# Patient Record
Sex: Female | Born: 1975 | Race: Asian | Hispanic: No | Marital: Married | Smoking: Never smoker
Health system: Southern US, Community
[De-identification: ages and names within clinical notes are randomized; demographics above are authoritative.]

## PROBLEM LIST (undated history)

## (undated) HISTORY — PX: INTRAUTERINE DEVICE INSERTION: SHX323

---

## 1998-09-10 ENCOUNTER — Inpatient Hospital Stay (HOSPITAL_COMMUNITY): Admission: AD | Admit: 1998-09-10 | Discharge: 1998-09-10 | Payer: Self-pay | Admitting: *Deleted

## 1998-09-23 ENCOUNTER — Encounter: Payer: Self-pay | Admitting: Obstetrics and Gynecology

## 1998-09-26 ENCOUNTER — Ambulatory Visit (HOSPITAL_COMMUNITY): Admission: RE | Admit: 1998-09-26 | Discharge: 1998-09-26 | Payer: Self-pay | Admitting: Obstetrics and Gynecology

## 1999-01-06 ENCOUNTER — Other Ambulatory Visit: Admission: RE | Admit: 1999-01-06 | Discharge: 1999-01-06 | Payer: Self-pay | Admitting: Obstetrics and Gynecology

## 2000-04-23 ENCOUNTER — Other Ambulatory Visit: Admission: RE | Admit: 2000-04-23 | Discharge: 2000-04-23 | Payer: Self-pay | Admitting: Obstetrics and Gynecology

## 2000-05-16 ENCOUNTER — Encounter: Payer: Self-pay | Admitting: Obstetrics and Gynecology

## 2000-05-16 ENCOUNTER — Ambulatory Visit (HOSPITAL_COMMUNITY): Admission: RE | Admit: 2000-05-16 | Discharge: 2000-05-16 | Payer: Self-pay | Admitting: Obstetrics and Gynecology

## 2000-06-13 ENCOUNTER — Ambulatory Visit (HOSPITAL_COMMUNITY): Admission: RE | Admit: 2000-06-13 | Discharge: 2000-06-13 | Payer: Self-pay | Admitting: Obstetrics and Gynecology

## 2000-06-13 ENCOUNTER — Encounter: Payer: Self-pay | Admitting: Obstetrics and Gynecology

## 2000-09-20 ENCOUNTER — Encounter: Payer: Self-pay | Admitting: Obstetrics and Gynecology

## 2000-09-20 ENCOUNTER — Ambulatory Visit (HOSPITAL_COMMUNITY): Admission: RE | Admit: 2000-09-20 | Discharge: 2000-09-20 | Payer: Self-pay | Admitting: Obstetrics and Gynecology

## 2000-10-11 ENCOUNTER — Inpatient Hospital Stay (HOSPITAL_COMMUNITY): Admission: AD | Admit: 2000-10-11 | Discharge: 2000-10-14 | Payer: Self-pay | Admitting: Obstetrics and Gynecology

## 2004-03-03 ENCOUNTER — Other Ambulatory Visit: Admission: RE | Admit: 2004-03-03 | Discharge: 2004-03-03 | Payer: Self-pay | Admitting: Obstetrics and Gynecology

## 2004-11-02 ENCOUNTER — Inpatient Hospital Stay (HOSPITAL_COMMUNITY): Admission: AD | Admit: 2004-11-02 | Discharge: 2004-11-04 | Payer: Self-pay | Admitting: Obstetrics and Gynecology

## 2005-05-21 ENCOUNTER — Other Ambulatory Visit: Admission: RE | Admit: 2005-05-21 | Discharge: 2005-05-21 | Payer: Self-pay | Admitting: Obstetrics and Gynecology

## 2012-01-24 ENCOUNTER — Ambulatory Visit: Payer: Self-pay

## 2012-02-17 ENCOUNTER — Ambulatory Visit: Payer: Self-pay | Admitting: Physician Assistant

## 2012-02-17 VITALS — BP 113/75 | HR 63 | Temp 98.0°F | Resp 16 | Ht 61.0 in | Wt 105.8 lb

## 2012-02-17 DIAGNOSIS — R05 Cough: Secondary | ICD-10-CM

## 2012-02-17 MED ORDER — ALBUTEROL SULFATE HFA 108 (90 BASE) MCG/ACT IN AERS: 2.0000 | INHALATION_SPRAY | RESPIRATORY_TRACT | Status: DC | PRN

## 2012-02-17 MED ORDER — AZITHROMYCIN 250 MG PO TABS: ORAL_TABLET | ORAL | Status: AC

## 2012-02-17 NOTE — Progress Notes (Signed)
  Subjective:    Patient ID: Penny Erickson, female    DOB: 1976/05/17, 36 y.o.   MRN: [id number]  HPI Ms. Mangas comes in today with a cough for 4 weeks.  Initially had cold symptoms with fever.  All symptoms resolved except paroxysmal cough.  No wheezing but tight cough.  Feels good otherwise.  Non smoker Healthy otherwise   Review of Systems As above     Objective:   Physical Exam  Constitutional: She is oriented to person, place, and time. Vital signs are normal. She appears well-developed and well-nourished.  HENT:  Right Ear: Tympanic membrane normal.  Left Ear: Tympanic membrane normal.  Nose: No mucosal edema.  Mouth/Throat: Oropharynx is clear and moist.  Cardiovascular: Normal rate and regular rhythm.   Pulmonary/Chest: Effort normal and breath sounds normal.  Lymphadenopathy:    She has no cervical adenopathy.  Neurological: She is alert and oriented to person, place, and time.         Assessment & Plan:  Cough, prolonged  Zithromax, albuterol inhaler, mucinex dm.  Push fluids.  Call if symptoms worsen.

## 2012-03-29 ENCOUNTER — Ambulatory Visit: Payer: Self-pay

## 2012-12-13 ENCOUNTER — Ambulatory Visit (INDEPENDENT_AMBULATORY_CARE_PROVIDER_SITE_OTHER): Payer: BC Managed Care – PPO | Admitting: Family Medicine

## 2012-12-13 VITALS — BP 115/72 | HR 76 | Temp 98.0°F | Resp 16 | Ht 61.0 in | Wt 111.0 lb

## 2012-12-13 DIAGNOSIS — R112 Nausea with vomiting, unspecified: Secondary | ICD-10-CM

## 2012-12-13 DIAGNOSIS — A088 Other specified intestinal infections: Secondary | ICD-10-CM

## 2012-12-13 DIAGNOSIS — A084 Viral intestinal infection, unspecified: Secondary | ICD-10-CM

## 2012-12-13 DIAGNOSIS — R51 Headache: Secondary | ICD-10-CM

## 2012-12-13 LAB — POCT UA - MICROSCOPIC ONLY
Bacteria, U Microscopic: NEGATIVE
Casts, Ur, LPF, POC: NEGATIVE
Crystals, Ur, HPF, POC: NEGATIVE
Mucus, UA: NEGATIVE
Yeast, UA: NEGATIVE

## 2012-12-13 LAB — POCT URINALYSIS DIPSTICK
Bilirubin, UA: NEGATIVE
Blood, UA: NEGATIVE
Glucose, UA: NEGATIVE
Ketones, UA: 40
Nitrite, UA: NEGATIVE
Protein, UA: 30
Spec Grav, UA: 1.025
Urobilinogen, UA: 1
pH, UA: 7

## 2012-12-13 LAB — POCT CBC
Granulocyte percent: 84 %G — AB (ref 37–80)
HCT, POC: 42.9 % (ref 37.7–47.9)
Hemoglobin: 13.7 g/dL (ref 12.2–16.2)
Lymph, poc: 1.1 (ref 0.6–3.4)
MCH, POC: 31.2 pg (ref 27–31.2)
MCHC: 31.9 g/dL (ref 31.8–35.4)
MCV: 97.7 fL — AB (ref 80–97)
MID (cbc): 0.3 (ref 0–0.9)
MPV: 7.5 fL (ref 0–99.8)
POC Granulocyte: 7.4 — AB (ref 2–6.9)
POC LYMPH PERCENT: 12.7 %L (ref 10–50)
POC MID %: 3.3 %M (ref 0–12)
Platelet Count, POC: 262 10*3/uL (ref 142–424)
RBC: 4.39 M/uL (ref 4.04–5.48)
RDW, POC: 12.3 %
WBC: 8.8 10*3/uL (ref 4.6–10.2)

## 2012-12-13 MED ORDER — ONDANSETRON 4 MG PO TBDP: ORAL_TABLET | ORAL | Status: DC

## 2012-12-13 MED ORDER — DICYCLOMINE HCL 10 MG PO CAPS: 10.0000 mg | ORAL_CAPSULE | Freq: Three times a day (TID) | ORAL | Status: DC

## 2012-12-13 NOTE — Progress Notes (Signed)
Subjective: 37 year old lady from [place]. She is getting sick last night. She had nausea and vomiting. When she would vomit her bowels moved but did not have diarrhea. She has had had a bad headache and some dizziness. No fever. Her children have had some respiratory tract illnesses, but no one else with the same vomiting illness in her home. She works doing Corporate treasurer. She took 2 Mucinex pills this morning.  Objective: She does not look like she feels well. Her TMs are normal. Eyes PERRLA. Throat clear. Neck supple without nodes. Her upper back has some red streaking where she rubbed hard in alleviating the headache. Her chest is clear. Heart regular without murmurs. Abdomen soft without masses tenderness.  Assessment: Gastritis Headache Dizziness  Plan: Check urine and CBC Results for orders placed in visit on 12/13/12  POCT CBC      Result Value Range   WBC 8.8  4.6 - 10.2 K/uL   Lymph, poc 1.1  0.6 - 3.4   POC LYMPH PERCENT 12.7  10 - 50 %L   MID (cbc) 0.3  0 - 0.9   POC MID % 3.3  0 - 12 %M   POC Granulocyte 7.4 (*) 2 - 6.9   Granulocyte percent 84.0 (*) 37 - 80 %G   RBC 4.39  4.04 - 5.48 M/uL   Hemoglobin 13.7  12.2 - 16.2 g/dL   HCT, POC 16.1  09.6 - 47.9 %   MCV 97.7 (*) 80 - 97 fL   MCH, POC 31.2  27 - 31.2 pg   MCHC 31.9  31.8 - 35.4 g/dL   RDW, POC 04.5     Platelet Count, POC 262  142 - 424 K/uL   MPV 7.5  0 - 99.8 fL  POCT UA - MICROSCOPIC ONLY      Result Value Range   WBC, Ur, HPF, POC 3-5     RBC, urine, microscopic 0-1     Bacteria, U Microscopic neg     Mucus, UA neg     Epithelial cells, urine per micros 0-2     Crystals, Ur, HPF, POC neg     Casts, Ur, LPF, POC neg     Yeast, UA neg    POCT URINALYSIS DIPSTICK      Result Value Range   Color, UA yellow     Clarity, UA cloudy     Glucose, UA neg     Bilirubin, UA neg     Ketones, UA 40     Spec Grav, UA 1.025     Blood, UA neg     pH, UA 7.0     Protein, UA 30     Urobilinogen, UA  1.0     Nitrite, UA neg     Leukocytes, UA Trace     Assessment: Viral gastroenteritis with mild dehydration  Treat symptomatically. We'll give some bentyl

## 2012-12-13 NOTE — Patient Instructions (Addendum)
Encourage fluids.  No milk or alcohol.  Use water, tea, sodas, juice, soup or broth.  You should drink enough so you urinate (pee) very often.  Take the ondansetron every 6 hours if needed for nausea and vomiting.    Take the dicyclomine before meals and at bedtime to help vomiting, cramping or diarrhea.  Take acetominophen (tylenol) 2 pills every 6-8 hours if needed for headache and neck pain.  Return if worse.

## 2014-03-24 ENCOUNTER — Ambulatory Visit (INDEPENDENT_AMBULATORY_CARE_PROVIDER_SITE_OTHER): Payer: BC Managed Care – PPO | Admitting: Emergency Medicine

## 2014-03-24 VITALS — BP 112/78 | HR 76 | Temp 97.7°F | Resp 16 | Ht 60.5 in | Wt 110.8 lb

## 2014-03-24 DIAGNOSIS — J018 Other acute sinusitis: Secondary | ICD-10-CM

## 2014-03-24 DIAGNOSIS — J209 Acute bronchitis, unspecified: Secondary | ICD-10-CM

## 2014-03-24 MED ORDER — AMOXICILLIN-POT CLAVULANATE 875-125 MG PO TABS: 1.0000 | ORAL_TABLET | Freq: Two times a day (BID) | ORAL | Status: DC

## 2014-03-24 MED ORDER — PROMETHAZINE-CODEINE 6.25-10 MG/5ML PO SYRP: 5.0000 mL | ORAL_SOLUTION | Freq: Four times a day (QID) | ORAL | Status: DC | PRN

## 2014-03-24 MED ORDER — PSEUDOEPHEDRINE-GUAIFENESIN ER 60-600 MG PO TB12: 1.0000 | ORAL_TABLET | Freq: Two times a day (BID) | ORAL | Status: DC

## 2014-03-24 NOTE — Progress Notes (Signed)
[facility] [address], [place] [id number] [phone]  Date:  03/24/2014   Name:  Penny Erickson   DOB:  11/07/1975   MRN:  [id number]  PCP:  Tally DueGUEST, CHRIS WARREN, MD    Chief Complaint: Cough, Sore Throat and Nasal Congestion   History of Present Illness:  Penny Erickson is a 38 y.o. very pleasant female patient who presents with the following:  Started with sore throat last week.  By the weekend she developed a cough and nasal drainage productive of mucocutaneous sputum.  No fever or chills. No wheezing but has some sense she cannot breath easily.  No nausea or vomiting.  No stool change or rash.  Sore throat improved with OTC pain medications.  Denies other complaint or health concern today.   There are no active problems to display for this patient.   No past medical history on file.  Past Surgical History  Procedure Laterality Date  . Intrauterine device insertion      History  Substance Use Topics  . Smoking status: Never Smoker   . Smokeless tobacco: Not on file  . Alcohol Use: No    Family History  Problem Relation Age of Onset  . Diabetes Maternal Grandmother     No Known Allergies  Medication list has been reviewed and updated.  Current Outpatient Prescriptions on File Prior to Visit  Medication Sig Dispense Refill  . albuterol (PROVENTIL HFA;VENTOLIN HFA) 108 (90 BASE) MCG/ACT inhaler Inhale 2 puffs into the lungs every 4 (four) hours as needed for wheezing.  1 Inhaler  0   No current facility-administered medications on file prior to visit.    Review of Systems:  As per HPI, otherwise negative.    Physical Examination: Filed Vitals:   03/24/14 1754  BP: 112/78  Pulse: 76  Temp: 97.7 F (36.5 C)  Resp: 16   Filed Vitals:   03/24/14 1754  Height: 5' 0.5" (1.537 m)  Weight: 110 lb 12.8 oz (50.259 kg)   Body mass index is 21.27 kg/(m^2). Ideal Body Weight: Weight in (lb) to have BMI = 25: 129.9  GEN: WDWN, NAD,  Non-toxic, A & O x 3 HEENT: Atraumatic, Normocephalic. Neck supple. No masses, No LAD. Ears and Nose: No external deformity. CV: RRR, No M/G/R. No JVD. No thrill. No extra heart sounds. PULM: CTA B, no wheezes, crackles, rhonchi. No retractions. No resp. distress. No accessory muscle use. ABD: S, NT, ND, +BS. No rebound. No HSM. EXTR: No c/c/e NEURO Normal gait.  PSYCH: Normally interactive. Conversant. Not depressed or anxious appearing.  Calm demeanor.    Assessment and Plan: Sinusitis Bronchitis Augmentin mucinex d Phen c cod   Signed,  Phillips OdorJeffery Anderson, MD

## 2014-03-24 NOTE — Patient Instructions (Signed)
Bronchitis  Bronchitis is inflammation of the airways that extend from the windpipe into the lungs (bronchi). The inflammation often causes mucus to develop, which leads to a cough. If the inflammation becomes severe, it may cause shortness of breath.  CAUSES   Bronchitis may be caused by:   · Viral infections.    · Bacteria.    · Cigarette smoke.    · Allergens, pollutants, and other irritants.    SIGNS AND SYMPTOMS   The most common symptom of bronchitis is a frequent cough that produces mucus. Other symptoms include:  · Fever.    · Body aches.    · Chest congestion.    · Chills.    · Shortness of breath.    · Sore throat.    DIAGNOSIS   Bronchitis is usually diagnosed through a medical history and physical exam. Tests, such as chest X-rays, are sometimes done to rule out other conditions.   TREATMENT   You may need to avoid contact with whatever caused the problem (smoking, for example). Medicines are sometimes needed. These may include:  · Antibiotics. These may be prescribed if the condition is caused by bacteria.  · Cough suppressants. These may be prescribed for relief of cough symptoms.    · Inhaled medicines. These may be prescribed to help open your airways and make it easier for you to breathe.    · Steroid medicines. These may be prescribed for those with recurrent (chronic) bronchitis.  HOME CARE INSTRUCTIONS  · Get plenty of rest.    · Drink enough fluids to keep your urine clear or pale yellow (unless you have a medical condition that requires fluid restriction). Increasing fluids may help thin your secretions and will prevent dehydration.    · Only take over-the-counter or prescription medicines as directed by your health care Neysa Arts.  · Only take antibiotics as directed. Make sure you finish them even if you start to feel better.  · Avoid secondhand smoke, irritating chemicals, and strong fumes. These will make bronchitis worse. If you are a smoker, quit smoking. Consider using nicotine gum or  skin patches to help control withdrawal symptoms. Quitting smoking will help your lungs heal faster.    · Put a cool-mist humidifier in your bedroom at night to moisten the air. This may help loosen mucus. Change the water in the humidifier daily. You can also run the hot water in your shower and sit in the bathroom with the door closed for 5-10 minutes.    · Follow up with your health care Garvey Westcott as directed.    · Wash your hands frequently to avoid catching bronchitis again or spreading an infection to others.    SEEK MEDICAL CARE IF:  Your symptoms do not improve after 1 week of treatment.   SEEK IMMEDIATE MEDICAL CARE IF:  · Your fever increases.  · You have chills.    · You have chest pain.    · You have worsening shortness of breath.    · You have bloody sputum.  · You faint.    · You have lightheadedness.  · You have a severe headache.    · You vomit repeatedly.  MAKE SURE YOU:   · Understand these instructions.  · Will watch your condition.  · Will get help right away if you are not doing well or get worse.  Document Released: 09/24/2005 Document Revised: 07/15/2013 Document Reviewed: 05/19/2013  ExitCare® Patient Information ©2015 [employer] LLC. This information is not intended to replace advice given to you by your health care Daxtin Leiker. Make sure you discuss any questions you have with your health care   Abdiaziz Klahn.  Sinusitis  Sinusitis is redness, soreness, and swelling (inflammation) of the paranasal sinuses. Paranasal sinuses are air pockets within the bones of your face (beneath the eyes, the middle of the forehead, or above the eyes). In healthy paranasal sinuses, mucus is able to drain out, and air is able to circulate through them by way of your nose. However, when your paranasal sinuses are inflamed, mucus and air can become trapped. This can allow bacteria and other germs to grow and cause infection.  Sinusitis can develop quickly and last only a short time (acute) or continue over a long period  (chronic). Sinusitis that lasts for more than 12 weeks is considered chronic.   CAUSES   Causes of sinusitis include:  · Allergies.  · Structural abnormalities, such as displacement of the cartilage that separates your nostrils (deviated septum), which can decrease the air flow through your nose and sinuses and affect sinus drainage.  · Functional abnormalities, such as when the small hairs (cilia) that line your sinuses and help remove mucus do not work properly or are not present.  SYMPTOMS   Symptoms of acute and chronic sinusitis are the same. The primary symptoms are pain and pressure around the affected sinuses. Other symptoms include:  · Upper toothache.  · Earache.  · Headache.  · Bad breath.  · Decreased sense of smell and taste.  · A cough, which worsens when you are lying flat.  · Fatigue.  · Fever.  · Thick drainage from your nose, which often is green and may contain pus (purulent).  · Swelling and warmth over the affected sinuses.  DIAGNOSIS   Your caregiver will perform a physical exam. During the exam, your caregiver may:  · Look in your nose for signs of abnormal growths in your nostrils (nasal polyps).  · Tap over the affected sinus to check for signs of infection.  · View the inside of your sinuses (endoscopy) with a special imaging device with a light attached (endoscope), which is inserted into your sinuses.  If your caregiver suspects that you have chronic sinusitis, one or more of the following tests may be recommended:  · Allergy tests.  · Nasal culture--A sample of mucus is taken from your nose and sent to a lab and screened for bacteria.  · Nasal cytology--A sample of mucus is taken from your nose and examined by your caregiver to determine if your sinusitis is related to an allergy.  TREATMENT   Most cases of acute sinusitis are related to a viral infection and will resolve on their own within 10 days. Sometimes medicines are prescribed to help relieve symptoms (pain medicine,  decongestants, nasal steroid sprays, or saline sprays).   However, for sinusitis related to a bacterial infection, your caregiver will prescribe antibiotic medicines. These are medicines that will help kill the bacteria causing the infection.   Rarely, sinusitis is caused by a fungal infection. In theses cases, your caregiver will prescribe antifungal medicine.  For some cases of chronic sinusitis, surgery is needed. Generally, these are cases in which sinusitis recurs more than 3 times per year, despite other treatments.  HOME CARE INSTRUCTIONS   · Drink plenty of water. Water helps thin the mucus so your sinuses can drain more easily.  · Use a humidifier.  · Inhale steam 3 to 4 times a day (for example, sit in the bathroom with the shower running).  · Apply a warm, moist washcloth to your face 3 to 4 times a   day, or as directed by your caregiver.  · Use saline nasal sprays to help moisten and clean your sinuses.  · Take over-the-counter or prescription medicines for pain, discomfort, or fever only as directed by your caregiver.  SEEK IMMEDIATE MEDICAL CARE IF:  · You have increasing pain or severe headaches.  · You have nausea, vomiting, or drowsiness.  · You have swelling around your face.  · You have vision problems.  · You have a stiff neck.  · You have difficulty breathing.  MAKE SURE YOU:   · Understand these instructions.  · Will watch your condition.  · Will get help right away if you are not doing well or get worse.  Document Released: 09/24/2005 Document Revised: 12/17/2011 Document Reviewed: 10/09/2011  ExitCare® Patient Information ©2015 [employer], LLC. This information is not intended to replace advice given to you by your health care Katrece Roediger. Make sure you discuss any questions you have with your health care Shaquoia Miers.

## 2014-04-12 ENCOUNTER — Ambulatory Visit (INDEPENDENT_AMBULATORY_CARE_PROVIDER_SITE_OTHER): Payer: BC Managed Care – PPO | Admitting: Family Medicine

## 2014-04-12 VITALS — HR 70 | Temp 97.2°F | Resp 16 | Ht 60.75 in | Wt 111.0 lb

## 2014-04-12 DIAGNOSIS — R5381 Other malaise: Secondary | ICD-10-CM

## 2014-04-12 DIAGNOSIS — R5383 Other fatigue: Secondary | ICD-10-CM

## 2014-04-12 DIAGNOSIS — R05 Cough: Secondary | ICD-10-CM

## 2014-04-12 DIAGNOSIS — N920 Excessive and frequent menstruation with regular cycle: Secondary | ICD-10-CM

## 2014-04-12 DIAGNOSIS — J209 Acute bronchitis, unspecified: Secondary | ICD-10-CM

## 2014-04-12 DIAGNOSIS — R059 Cough, unspecified: Secondary | ICD-10-CM

## 2014-04-12 LAB — CBC WITH DIFFERENTIAL/PLATELET
BASOS ABS: 0.1 10*3/uL (ref 0.0–0.1)
Basophils Relative: 1 % (ref 0–1)
Eosinophils Absolute: 0.3 10*3/uL (ref 0.0–0.7)
Eosinophils Relative: 4 % (ref 0–5)
HCT: 39.1 % (ref 36.0–46.0)
Hemoglobin: 13.7 g/dL (ref 12.0–15.0)
LYMPHS ABS: 2.3 10*3/uL (ref 0.7–4.0)
LYMPHS PCT: 34 % (ref 12–46)
MCH: 31.9 pg (ref 26.0–34.0)
MCHC: 35 g/dL (ref 30.0–36.0)
MCV: 91.1 fL (ref 78.0–100.0)
Monocytes Absolute: 0.4 10*3/uL (ref 0.1–1.0)
Monocytes Relative: 6 % (ref 3–12)
NEUTROS PCT: 55 % (ref 43–77)
Neutro Abs: 3.7 10*3/uL (ref 1.7–7.7)
Platelets: 251 10*3/uL (ref 150–400)
RBC: 4.29 MIL/uL (ref 3.87–5.11)
RDW: 12.2 % (ref 11.5–15.5)
WBC: 6.8 10*3/uL (ref 4.0–10.5)

## 2014-04-12 LAB — POCT SEDIMENTATION RATE: POCT SED RATE: 12 mm/hr (ref 0–22)

## 2014-04-12 MED ORDER — AZITHROMYCIN 250 MG PO TABS: ORAL_TABLET | ORAL | Status: DC

## 2014-04-12 MED ORDER — ALBUTEROL SULFATE HFA 108 (90 BASE) MCG/ACT IN AERS: 2.0000 | INHALATION_SPRAY | RESPIRATORY_TRACT | Status: DC | PRN

## 2014-04-12 MED ORDER — BENZONATATE 200 MG PO CAPS: 200.0000 mg | ORAL_CAPSULE | Freq: Three times a day (TID) | ORAL | Status: DC | PRN

## 2014-04-12 NOTE — Progress Notes (Signed)
Subjective:  This chart was scribed for Penny Cheadle, MD by Randa Evens, ED Scribe. This Patient was seen in room 04 and the patients care was started at 6:52 PM   Patient ID: Penny Erickson, female    DOB: [birth year], 38 y.o.   MRN: [id number]  Cough Associated symptoms include myalgias and shortness of breath. Pertinent negatives include no chest pain, fever or wheezing.   Chief Complaint  Patient presents with  . Cough    All X 3 days  . Fatigue  . Generalized Body Aches    HPI Comments: Penny Erickson is a 38 y.o. female who presents to [facility] complaining of cough onset 3 days prior. She states that she has associated fatigue, myalgias, and SOB. She states that her cough is worsened with activity. She states she has taken cough medicine with some relief. She states she has taken mucinex with no relief. She denies using the inhaler that was previously prescribed. She states that she has not had any recent sick contacts.She states she had a sinus infection 3 weeks prior for which she fully recovered but only for a few days before she developed this illness. She denies fever, chest pain, wheezing, nausea, or vomiting. She denies any recent travel outside of the country.   She states that her menstrual cycle has been 2 weeks long and that she has this prob freq.   Pt is [place] and is here with her husband. English is somewhat limited.  History reviewed. No pertinent past medical history. Current Outpatient Prescriptions on File Prior to Visit  Medication Sig Dispense Refill  . pseudoephedrine-guaifenesin (MUCINEX D) 60-600 MG per tablet Take 1 tablet by mouth every 12 (twelve) hours.  18 tablet  0   No current facility-administered medications on file prior to visit.   No Known Allergies  Review of Systems  Constitutional: Positive for fatigue. Negative for fever.  Respiratory: Positive for cough and shortness of breath. Negative for wheezing.     Cardiovascular: Negative for chest pain.  Gastrointestinal: Negative for nausea and vomiting.  Musculoskeletal: Positive for myalgias.     Objective:  Pulse 70  Temp(Src) 97.2 F (36.2 C) (Oral)  Resp 16  Ht 5' 0.75" (1.543 m)  Wt 111 lb (50.349 kg)  BMI 21.15 kg/m2  SpO2 100%  PF 410 L/min  LMP 03/02/2014  Physical Exam  Nursing note and vitals reviewed. Constitutional: She is oriented to person, place, and time. She appears well-developed and well-nourished. No distress.  HENT:  Head: Normocephalic and atraumatic.  Right Ear: A middle ear effusion is present.  Left Ear: A middle ear effusion is present.  Nose: Mucosal edema (pale) and rhinorrhea present.  Mouth/Throat: Oropharynx is clear and moist.  Eyes: Conjunctivae and EOM are normal.  Neck: Neck supple. No tracheal deviation present.  Cardiovascular: Normal rate, regular rhythm, S1 normal, S2 normal and normal heart sounds.   Pulmonary/Chest: Effort normal and breath sounds normal. No respiratory distress.  Musculoskeletal: Normal range of motion.  Lymphadenopathy:    She has cervical adenopathy (mild anterior).  Neurological: She is alert and oriented to person, place, and time.  Skin: Skin is warm and dry.  Psychiatric: She has a normal mood and affect. Her behavior is normal.    Predicted peak flow 475 - pt's was 410  Assessment & Plan:   Cough - Plan: POCT SEDIMENTATION RATE, CBC with Differential  Acute bronchitis, unspecified organism  Other malaise and fatigue  Menorrhagia  with regular cycle  Meds ordered this encounter  Medications  . azithromycin (ZITHROMAX) 250 MG tablet    Sig: Take 2 tabs PO x 1 dose, then 1 tab PO QD x 4 days    Dispense:  6 tablet    Refill:  0  . benzonatate (TESSALON) 200 MG capsule    Sig: Take 1 capsule (200 mg total) by mouth 3 (three) times daily as needed for cough.    Dispense:  30 capsule    Refill:  0  . albuterol (PROVENTIL HFA;VENTOLIN HFA) 108 (90 BASE)  MCG/ACT inhaler    Sig: Inhale 2 puffs into the lungs every 4 (four) hours as needed for wheezing or shortness of breath (cough, shortness of breath or wheezing.).    Dispense:  1 Inhaler    Refill:  1    I personally performed the services described in this documentation, which was scribed in my presence. The recorded information has been reviewed and considered, and addended by me as needed.  Penny Cheadle, MD MPH  Results for orders placed in visit on 04/12/14  CBC WITH DIFFERENTIAL      Result Value Ref Range   WBC 6.8  4.0 - 10.5 K/uL   RBC 4.29  3.87 - 5.11 MIL/uL   Hemoglobin 13.7  12.0 - 15.0 g/dL   HCT 39.1  36.0 - 46.0 %   MCV 91.1  78.0 - 100.0 fL   MCH 31.9  26.0 - 34.0 pg   MCHC 35.0  30.0 - 36.0 g/dL   RDW 12.2  11.5 - 15.5 %   Platelets 251  150 - 400 K/uL   Neutrophils Relative % 55  43 - 77 %   Neutro Abs 3.7  1.7 - 7.7 K/uL   Lymphocytes Relative 34  12 - 46 %   Lymphs Abs 2.3  0.7 - 4.0 K/uL   Monocytes Relative 6  3 - 12 %   Monocytes Absolute 0.4  0.1 - 1.0 K/uL   Eosinophils Relative 4  0 - 5 %   Eosinophils Absolute 0.3  0.0 - 0.7 K/uL   Basophils Relative 1  0 - 1 %   Basophils Absolute 0.1  0.0 - 0.1 K/uL   Smear Review Criteria for review not met    POCT SEDIMENTATION RATE      Result Value Ref Range   POCT SED RATE 12  0 - 22 mm/hr

## 2014-04-12 NOTE — Patient Instructions (Signed)
If your cough or shortness of breath continues, make sure you come back in immediately. If you develop wheezing or chest pain or heart racing/skipping beats, make sure you come back in immediately or go to ER. Take aleve twice daily with food to help with back pain with deep breathing and coughing.  Acute Bronchitis Bronchitis is inflammation of the airways that extend from the windpipe into the lungs (bronchi). The inflammation often causes mucus to develop. This leads to a cough, which is the most common symptom of bronchitis.  In acute bronchitis, the condition usually develops suddenly and goes away over time, usually in a couple weeks. Smoking, allergies, and asthma can make bronchitis worse. Repeated episodes of bronchitis may cause further lung problems.  CAUSES Acute bronchitis is most often caused by the same virus that causes a cold. The virus can spread from person to person (contagious).  SIGNS AND SYMPTOMS   Cough.   Fever.   Coughing up mucus.   Body aches.   Chest congestion.   Chills.   Shortness of breath.   Sore throat.  DIAGNOSIS  Acute bronchitis is usually diagnosed through a physical exam. Tests, such as chest X-rays, are sometimes done to rule out other conditions.  TREATMENT  Acute bronchitis usually goes away in a couple weeks. Often times, no medical treatment is necessary. Medicines are sometimes given for relief of fever or cough. Antibiotics are usually not needed but may be prescribed in certain situations. In some cases, an inhaler may be recommended to help reduce shortness of breath and control the cough. A cool mist vaporizer may also be used to help thin bronchial secretions and make it easier to clear the chest.  HOME CARE INSTRUCTIONS  Get plenty of rest.   Drink enough fluids to keep your urine clear or pale yellow (unless you have a medical condition that requires fluid restriction). Increasing fluids may help thin your secretions and  will prevent dehydration.   Only take over-the-counter or prescription medicines as directed by your health care provider.   Avoid smoking and secondhand smoke. Exposure to cigarette smoke or irritating chemicals will make bronchitis worse. If you are a smoker, consider using nicotine gum or skin patches to help control withdrawal symptoms. Quitting smoking will help your lungs heal faster.   Reduce the chances of another bout of acute bronchitis by washing your hands frequently, avoiding people with cold symptoms, and trying not to touch your hands to your mouth, nose, or eyes.   Follow up with your health care provider as directed.  SEEK MEDICAL CARE IF: Your symptoms do not improve after 1 week of treatment.  SEEK IMMEDIATE MEDICAL CARE IF:  You develop an increased fever or chills.   You have chest pain.   You have severe shortness of breath.  You have bloody sputum.   You develop dehydration.  You develop fainting.  You develop repeated vomiting.  You develop a severe headache. MAKE SURE YOU:   Understand these instructions.  Will watch your condition.  Will get help right away if you are not doing well or get worse. Document Released: 11/01/2004 Document Revised: 05/27/2013 Document Reviewed: 03/17/2013 [facility] Patient Information 2015 [place]. This information is not intended to replace advice given to you by your health care provider. Make sure you discuss any questions you have with your health care provider.

## 2014-04-16 ENCOUNTER — Encounter: Payer: Self-pay | Admitting: Family Medicine

## 2014-07-16 ENCOUNTER — Emergency Department (HOSPITAL_COMMUNITY)
Admission: EM | Admit: 2014-07-16 | Discharge: 2014-07-16 | Disposition: A | Discharge status: Home / Self Care | Payer: No Typology Code available for payment source | Attending: Emergency Medicine | Admitting: Emergency Medicine

## 2014-07-16 ENCOUNTER — Encounter (HOSPITAL_COMMUNITY): Payer: Self-pay | Admitting: Emergency Medicine

## 2014-07-16 DIAGNOSIS — Y92410 Unspecified street and highway as the place of occurrence of the external cause: Secondary | ICD-10-CM | POA: Diagnosis not present

## 2014-07-16 DIAGNOSIS — Y9389 Activity, other specified: Secondary | ICD-10-CM | POA: Insufficient documentation

## 2014-07-16 DIAGNOSIS — R32 Unspecified urinary incontinence: Secondary | ICD-10-CM | POA: Insufficient documentation

## 2014-07-16 DIAGNOSIS — Z79899 Other long term (current) drug therapy: Secondary | ICD-10-CM | POA: Insufficient documentation

## 2014-07-16 DIAGNOSIS — Z792 Long term (current) use of antibiotics: Secondary | ICD-10-CM | POA: Insufficient documentation

## 2014-07-16 DIAGNOSIS — S39012A Strain of muscle, fascia and tendon of lower back, initial encounter: Secondary | ICD-10-CM | POA: Insufficient documentation

## 2014-07-16 DIAGNOSIS — S161XXA Strain of muscle, fascia and tendon at neck level, initial encounter: Secondary | ICD-10-CM | POA: Diagnosis not present

## 2014-07-16 DIAGNOSIS — Z975 Presence of (intrauterine) contraceptive device: Secondary | ICD-10-CM | POA: Diagnosis not present

## 2014-07-16 DIAGNOSIS — S199XXA Unspecified injury of neck, initial encounter: Secondary | ICD-10-CM | POA: Diagnosis present

## 2014-07-16 DIAGNOSIS — T148XXA Other injury of unspecified body region, initial encounter: Secondary | ICD-10-CM

## 2014-07-16 MED ORDER — CYCLOBENZAPRINE HCL 10 MG PO TABS: 10.0000 mg | ORAL_TABLET | Freq: Two times a day (BID) | ORAL | Status: AC | PRN

## 2014-07-16 MED ORDER — TRAMADOL HCL 50 MG PO TABS
50.0000 mg | ORAL_TABLET | Freq: Four times a day (QID) | ORAL | Status: AC | PRN
Start: 2014-07-16 — End: ?
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
## 2014-07-16 NOTE — Discharge Instructions (Signed)
C?ng c? (Muscle Strain) C?ng c? l m?t ch?n th??ng x?y ra khi c? b? ko c?ng qu chi?u di bnh th??ng c?a c?. Thng th??ng, m?t s? t cc s?i c? b? rch khi x?y ra c?ng c?. C?ng c? ???c x?p lo?i theo m?c ??. C?ng c? m?c ?? 1 c s? s?i c? rch v ?au t nh?t. C?ng c? m?c ?? 2 v m?c ?? 3 c rch v ?au t?ng h?n.  C?ng c? th??ng ph?c h?i sau 1 - 2 tu?n. C?ng c? s? lnh hon ton sau 5 - 6 tu?n.  NGUYN NHN.  C?ng c? x?y ra khi m?t l?c m?nh ??t ng?t tc ??ng ln c? khi?n c? b? ko c?ng qu m?c. ?i?u ny c th? x?y ra khi nng v?t n?ng, ch?i th? thao ho?c b? ng.  CC Y?U T? NGUY C? C?ng c? ??c bi?t ph? bi?n ? cc v?n ??ng vin.  D?U HI?U V TRI?U CH?NG T?i v? tr b? c?ng c?, c th?:  ?au.  B?m tm.  S?ng t?y.  S? d?ng c? kh kh?n v ?au ho?c khng c ch?c n?ng bnh th??ng. CH?N ?ON  Chuyn gia ch?m [place] s?c kh?e c?a qu v? s? khm th?c th? v h?i v? b?nh s? c?a qu v?. ?I?U TR?  Thng th??ng, vi?c ?i?u tr? c?ng c? t?t nh?t l ngh? ng?i, ch??m ?, v ch??m l?nh vo vng b? th??ng.  H??NG D?N CH?M [place] T?I NH   S? d?ng ph??ng php ?i?u tr? PRICE ?? c? mau lnh trong 2 - 3 ngy ??u tin sau ch?n th??ng. Ph??ng php PRICE bao g?m:  B?o v? c? kh?i b? th??ng m?t l?n n?a.  H?n ch? ho?t ??ng c?a qu v? v cho ph?n c? th? b? th??ng ???c ngh? ng?i.  Ch??m ? l?nh vo ch? b? th??ng. ?? lm vi?c ny, cho ? l?nh vo ti nh?a. ?? kh?n t?m vo gi?a da v ti ? l?nh. Sau ? ch??m ? l?nh v ?? t?i ch? t? 15 - 20 pht m?i gi?. Sau ngy th? ba, chuy?n sang dng cc ti nhi?t ?m.  Ch??m vo vng b? th??ng b?ng n?p ho?c b?ng co gin. Hy c?n th?n ?? khng b?ng qu ch?t. ?i?u ny c th? c?n tr? l?u thng mu ho?c lm s?ng thm.  Nng ph?n c? th? b? th??ng cao h?n tim qu v? cng th??ng xuyn cng t?t.  Ch? s? d?ng thu?c khng c?n k ??n ho?c thu?c c?n k ??n ?? gi?m ?au, gi?m c?m gic kh ch?u ho?c h? s?t theo ch? d?n c?a chuyn gia ch?m  s?c kh?e c?a qu v?.  Kh?i ??ng tr??c khi t?p th? d?c  gip ng?n ng?a b? c?ng c?. ?I KHM N?U:   Qu v? b? ?au ho?c s?ng t?ng ln ? vng b? th??ng.  Qu v? b? t, ?au bu?t, ho?c m?t ?? b?n ?ng k? ? vng b? th??ng. ??M B?O QU V?:   Hi?u r cc h??ng d?n ny.  S? theo di tnh tr?ng c?a mnh.  S? yu c?u tr? gip ngay l?p t?c n?u qu v? c?m th?y khng kh?e ho?c th?y tr?m tr?ng h?n. Document Released: 03/25/2012 Document Revised: 07/15/2013 [facility] Patient Information 2015 [place], [place]. This information is not intended to replace advice given to you by your health care provider. Make sure you discuss any questions you have with your health care provider.

## 2014-07-18 NOTE — ED Provider Notes (Signed)
Medical screening examination/treatment/procedure(s) were performed by non-physician practitioner and as supervising physician I was immediately available for consultation/collaboration.   EKG Interpretation None       Jeniffer Culliver, MD 07/18/14 0000 

## 2014-10-18 ENCOUNTER — Other Ambulatory Visit (HOSPITAL_COMMUNITY): Payer: Self-pay | Admitting: Obstetrics and Gynecology

## 2014-10-18 DIAGNOSIS — E041 Nontoxic single thyroid nodule: Secondary | ICD-10-CM

## 2014-11-02 ENCOUNTER — Ambulatory Visit (HOSPITAL_COMMUNITY): Payer: No Typology Code available for payment source

## 2014-11-04 ENCOUNTER — Ambulatory Visit (HOSPITAL_COMMUNITY)
Admission: RE | Admit: 2014-11-04 | Discharge: 2014-11-04 | Disposition: A | Discharge status: Home / Self Care | Payer: BLUE CROSS/BLUE SHIELD | Source: Ambulatory Visit | Attending: Obstetrics and Gynecology | Admitting: Obstetrics and Gynecology

## 2014-11-04 DIAGNOSIS — E041 Nontoxic single thyroid nodule: Secondary | ICD-10-CM | POA: Insufficient documentation

## 2014-11-04 DIAGNOSIS — Z85850 Personal history of malignant neoplasm of thyroid: Secondary | ICD-10-CM | POA: Insufficient documentation

## 2022-06-19 ENCOUNTER — Other Ambulatory Visit: Payer: Self-pay | Admitting: Obstetrics and Gynecology

## 2022-06-19 DIAGNOSIS — R928 Other abnormal and inconclusive findings on diagnostic imaging of breast: Secondary | ICD-10-CM

## 2022-08-20 ENCOUNTER — Ambulatory Visit
Admission: RE | Admit: 2022-08-20 | Discharge: 2022-08-20 | Disposition: A | Discharge status: Home / Self Care | Payer: BLUE CROSS/BLUE SHIELD | Source: Ambulatory Visit | Attending: Obstetrics and Gynecology | Admitting: Obstetrics and Gynecology

## 2022-08-20 ENCOUNTER — Ambulatory Visit
Admission: RE | Admit: 2022-08-20 | Discharge: 2022-08-20 | Disposition: A | Discharge status: Home / Self Care | Payer: BC Managed Care – PPO | Source: Ambulatory Visit | Attending: Obstetrics and Gynecology | Admitting: Obstetrics and Gynecology

## 2022-08-20 DIAGNOSIS — R928 Other abnormal and inconclusive findings on diagnostic imaging of breast: Secondary | ICD-10-CM
